# Patient Record
Sex: Female | Born: 1950 | Race: White | Hispanic: No | Marital: Married | Smoking: Current every day smoker
Health system: Southern US, Community
[De-identification: ages and names within clinical notes are randomized; demographics above are authoritative.]

## PROBLEM LIST (undated history)

## (undated) ENCOUNTER — Ambulatory Visit

## (undated) DIAGNOSIS — Z972 Presence of dental prosthetic device (complete) (partial): Secondary | ICD-10-CM

## (undated) DIAGNOSIS — C801 Malignant (primary) neoplasm, unspecified: Secondary | ICD-10-CM

## (undated) DIAGNOSIS — Z85528 Personal history of other malignant neoplasm of kidney: Secondary | ICD-10-CM

## (undated) HISTORY — PX: HERNIA REPAIR: SHX51

---

## 2005-08-21 ENCOUNTER — Emergency Department: Payer: Self-pay | Admitting: Emergency Medicine

## 2008-03-07 ENCOUNTER — Ambulatory Visit: Payer: Self-pay | Admitting: Urology

## 2008-03-20 ENCOUNTER — Ambulatory Visit: Payer: Self-pay | Admitting: Urology

## 2009-09-26 ENCOUNTER — Emergency Department: Payer: Self-pay | Admitting: Emergency Medicine

## 2009-10-25 DIAGNOSIS — Z85528 Personal history of other malignant neoplasm of kidney: Secondary | ICD-10-CM

## 2009-10-25 DIAGNOSIS — C801 Malignant (primary) neoplasm, unspecified: Secondary | ICD-10-CM

## 2009-10-25 HISTORY — DX: Personal history of other malignant neoplasm of kidney: Z85.528

## 2009-10-25 HISTORY — PX: NEPHRECTOMY: SHX65

## 2009-10-25 HISTORY — PX: DIRECT LARYNGOSCOPY: SHX5326

## 2009-10-25 HISTORY — DX: Malignant (primary) neoplasm, unspecified: C80.1

## 2016-03-01 ENCOUNTER — Encounter: Payer: Self-pay | Admitting: *Deleted

## 2016-03-04 ENCOUNTER — Ambulatory Visit: Payer: Medicare HMO | Admitting: Anesthesiology

## 2016-03-04 ENCOUNTER — Ambulatory Visit
Admission: RE | Admit: 2016-03-04 | Discharge: 2016-03-04 | Disposition: A | Discharge status: Home / Self Care | Payer: Medicare HMO | Source: Ambulatory Visit | Attending: Otolaryngology | Admitting: Otolaryngology

## 2016-03-04 ENCOUNTER — Encounter
Admission: RE | Disposition: A | Discharge status: Home / Self Care | Payer: Self-pay | Source: Ambulatory Visit | Attending: Otolaryngology

## 2016-03-04 DIAGNOSIS — Z79899 Other long term (current) drug therapy: Secondary | ICD-10-CM | POA: Insufficient documentation

## 2016-03-04 DIAGNOSIS — J381 Polyp of vocal cord and larynx: Secondary | ICD-10-CM | POA: Diagnosis present

## 2016-03-04 DIAGNOSIS — Z9851 Tubal ligation status: Secondary | ICD-10-CM | POA: Insufficient documentation

## 2016-03-04 DIAGNOSIS — Z8249 Family history of ischemic heart disease and other diseases of the circulatory system: Secondary | ICD-10-CM | POA: Diagnosis not present

## 2016-03-04 DIAGNOSIS — F17200 Nicotine dependence, unspecified, uncomplicated: Secondary | ICD-10-CM | POA: Insufficient documentation

## 2016-03-04 HISTORY — PX: DIRECT LARYNGOSCOPY: SHX5326

## 2016-03-04 HISTORY — DX: Presence of dental prosthetic device (complete) (partial): Z97.2

## 2016-03-04 HISTORY — DX: Personal history of other malignant neoplasm of kidney: Z85.528

## 2016-03-04 SURGERY — LARYNGOSCOPY, DIRECT
Anesthesia: General | Laterality: Left | Wound class: Clean Contaminated

## 2016-03-04 MED ORDER — EPHEDRINE SULFATE 50 MG/ML IJ SOLN
INTRAMUSCULAR | Status: DC | PRN
  Administered 2016-03-04 (×2): 5 mg via INTRAVENOUS

## 2016-03-04 MED ORDER — ONDANSETRON HCL 4 MG/2ML IJ SOLN: 4.0000 mg | Freq: Once | INTRAMUSCULAR | Status: DC | PRN

## 2016-03-04 MED ORDER — SUCCINYLCHOLINE CHLORIDE 20 MG/ML IJ SOLN
INTRAMUSCULAR | Status: DC | PRN
  Administered 2016-03-04: 80 mg via INTRAVENOUS

## 2016-03-04 MED ORDER — PHENYLEPHRINE HCL 0.5 % NA SOLN
NASAL | Status: DC | PRN
  Administered 2016-03-04: 10:00:00 via TOPICAL

## 2016-03-04 MED ORDER — PROPOFOL 10 MG/ML IV BOLUS
INTRAVENOUS | Status: DC | PRN
  Administered 2016-03-04: 130 mg via INTRAVENOUS
  Administered 2016-03-04 (×2): 30 mg via INTRAVENOUS
  Administered 2016-03-04: 10 mg via INTRAVENOUS

## 2016-03-04 MED ORDER — MIDAZOLAM HCL 5 MG/5ML IJ SOLN
INTRAMUSCULAR | Status: DC | PRN
Start: 2016-03-04 — End: 2016-03-04
  Administered 2016-03-04: 2 mg via INTRAVENOUS

## 2016-03-04 MED ORDER — GLYCOPYRROLATE 0.2 MG/ML IJ SOLN
INTRAMUSCULAR | Status: DC | PRN
  Administered 2016-03-04: 0.1 mg via INTRAVENOUS

## 2016-03-04 MED ORDER — SCOPOLAMINE 1 MG/3DAYS TD PT72
1.0000 | MEDICATED_PATCH | Freq: Once | TRANSDERMAL | Status: DC
  Administered 2016-03-04: 1.5 mg via TRANSDERMAL

## 2016-03-04 MED ORDER — ACETAMINOPHEN 10 MG/ML IV SOLN: 1000.0000 mg | Freq: Once | INTRAVENOUS | Status: DC

## 2016-03-04 MED ORDER — FENTANYL CITRATE (PF) 100 MCG/2ML IJ SOLN
INTRAMUSCULAR | Status: DC | PRN
  Administered 2016-03-04: 100 ug via INTRAVENOUS

## 2016-03-04 MED ORDER — ACETAMINOPHEN 10 MG/ML IV SOLN
750.0000 mg | Freq: Once | INTRAVENOUS | Status: AC
  Administered 2016-03-04: 750 mg via INTRAVENOUS

## 2016-03-04 MED ORDER — LACTATED RINGERS IV SOLN
INTRAVENOUS | Status: DC
  Administered 2016-03-04: 09:00:00 via INTRAVENOUS

## 2016-03-04 MED ORDER — FENTANYL CITRATE (PF) 100 MCG/2ML IJ SOLN: 25.0000 ug | INTRAMUSCULAR | Status: DC | PRN

## 2016-03-04 MED ORDER — ONDANSETRON HCL 4 MG/2ML IJ SOLN
INTRAMUSCULAR | Status: DC | PRN
  Administered 2016-03-04: 4 mg via INTRAVENOUS

## 2016-03-04 MED ORDER — LIDOCAINE HCL (CARDIAC) 20 MG/ML IV SOLN
INTRAVENOUS | Status: DC | PRN
  Administered 2016-03-04: 40 mg via INTRAVENOUS

## 2016-03-04 MED ORDER — DEXAMETHASONE SODIUM PHOSPHATE 4 MG/ML IJ SOLN
INTRAMUSCULAR | Status: DC | PRN
  Administered 2016-03-04: 10 mg via INTRAVENOUS

## 2016-03-04 MED ORDER — LIDOCAINE HCL 4 % MT SOLN
OROMUCOSAL | Status: DC | PRN
  Administered 2016-03-04: 2 mL via TOPICAL

## 2016-03-04 SURGICAL SUPPLY — 24 items
BASIN GRAD PLASTIC 32OZ STRL (MISCELLANEOUS) IMPLANT
BLOCK BITE GUARD (MISCELLANEOUS) ×3 IMPLANT
COVER MAYO STAND STRL (DRAPES) ×3 IMPLANT
COVER TABLE BACK 60X90 (DRAPES) ×3 IMPLANT
CUP MEDICINE 2OZ PLAST GRAD ST (MISCELLANEOUS) IMPLANT
DRAPE SHEET LG 3/4 BI-LAMINATE (DRAPES) ×3 IMPLANT
DRESSING TELFA 4X3 1S ST N-ADH (GAUZE/BANDAGES/DRESSINGS) ×3 IMPLANT
GLOVE PI ULTRA LF STRL 7.5 (GLOVE) ×1 IMPLANT
GLOVE PI ULTRA NON LATEX 7.5 (GLOVE) ×2
KIT ROOM TURNOVER OR (KITS) ×3 IMPLANT
MARKER SKIN DUAL TIP RULER LAB (MISCELLANEOUS) IMPLANT
NEEDLE 18GX1X1/2 (RX/OR ONLY) (NEEDLE) IMPLANT
NEEDLE FILTER BLUNT 18X 1/2SAF (NEEDLE)
NEEDLE FILTER BLUNT 18X1 1/2 (NEEDLE) IMPLANT
NS IRRIG 500ML POUR BTL (IV SOLUTION) IMPLANT
PATTIES SURGICAL .5 X.5 (GAUZE/BANDAGES/DRESSINGS) ×3 IMPLANT
SOL ANTI-FOG 6CC FOG-OUT (MISCELLANEOUS) ×1 IMPLANT
SOL FOG-OUT ANTI-FOG 6CC (MISCELLANEOUS) ×2
SPONGE XRAY 4X4 16PLY STRL (MISCELLANEOUS) ×3 IMPLANT
STRAP BODY AND KNEE 60X3 (MISCELLANEOUS) ×3 IMPLANT
SYRINGE 10CC LL (SYRINGE) IMPLANT
TOWEL OR 17X26 4PK STRL BLUE (TOWEL DISPOSABLE) ×3 IMPLANT
TUBING CONN 6MMX3.1M (TUBING) ×2
TUBING SUCTION CONN 0.25 STRL (TUBING) ×1 IMPLANT

## 2016-03-04 NOTE — Anesthesia Preprocedure Evaluation (Signed)
Anesthesia Evaluation  Patient identified by MRN, date of birth, ID band  Reviewed: Allergy & Precautions, H&P , NPO status , Patient's Chart, lab work & pertinent test results  Airway Mallampati: II  TM Distance: >3 FB Neck ROM: full    Dental  (+) Upper Dentures, Lower Dentures   Pulmonary Current Smoker,    Pulmonary exam normal        Cardiovascular  Rhythm:regular Rate:Normal     Neuro/Psych    GI/Hepatic   Endo/Other    Renal/GU      Musculoskeletal   Abdominal   Peds  Hematology   Anesthesia Other Findings   Reproductive/Obstetrics                             Anesthesia Physical Anesthesia Plan  ASA: II  Anesthesia Plan: General ETT   Post-op Pain Management:    Induction:   Airway Management Planned:   Additional Equipment:   Intra-op Plan:   Post-operative Plan:   Informed Consent: I have reviewed the patients History and Physical, chart, labs and discussed the procedure including the risks, benefits and alternatives for the proposed anesthesia with the patient or authorized representative who has indicated his/her understanding and acceptance.     Plan Discussed with: CRNA  Anesthesia Plan Comments:         Anesthesia Quick Evaluation

## 2016-03-04 NOTE — Op Note (Signed)
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  Procedure: The patient was given general anesthesia by oral endotracheal intubation using a size 6 oral endotracheal tube with a large cuff. When she was fully sleep a Dedo laryngoscope was used for visualizing the hypopharynx and larynx. The epiglottis was normal and the vallecula was clear. The piriform sinuses were clear area the larynx was visualized and there is watery polyp on the left true cord with a minimal amount of swelling on the right true cord. The Louis arm was brought in for stabilization of the scope and then endoscopes were used for visualizing this under high-power magnification. The watery polyp was evident on the left true cord.  An up-biting cup forceps was used to grasp the midportion of the polyp. And up cutting scissors were used to incise mucosa anteriorly along the superior border of the true cord. The mucosa was then cut along its more inferior border to remove the polyp. Once this was removed it still see polypoid changes along the more dorsal surface of the true cord. The cup biting forceps was used to grab this and it was removed by scissors again. Polyp extended almost all the way down to the vocal process and was trimmed down there as well as all the way up to the amp cheerier commissure and it was trimmed there. The specimen was in multiple pieces and was sent for permanent section. The muscle of the true cord was intact and partially exposed along the portion of the cord, because of removing the swollen diseased mucosa. Cottonoid pledgets soaked in phenylephrine 100,000 were used for vasoconstriction. There  was minimal oozing evident throughout the procedure. Lidocaine had been squirted in the larynx for helping with anesthesia. Approximately 80 mg were squirted in the larynx and then the excess suction away.  The scope was then removed. The patient was awakened and taken to the recovery room in satisfactory condition. There were no operative complications.  Dispo:   To PACU to be discharged home  Plan:  Voice rest for 1 week. We'll follow up in 1 week to discuss the pathology. She is to avoid smoking. She can increase her diet as tolerated  Carlee Tesfaye H  03/04/2016 10:03 AM

## 2016-03-04 NOTE — Anesthesia Procedure Notes (Signed)
Procedure Name: Intubation Date/Time: 03/04/2016 9:34 AM Performed by: Mayme Genta Pre-anesthesia Checklist: Patient identified, Emergency Drugs available, Suction available, Patient being monitored and Timeout performed Patient Re-evaluated:Patient Re-evaluated prior to inductionOxygen Delivery Method: Circle system utilized Preoxygenation: Pre-oxygenation with 100% oxygen Intubation Type: IV induction Ventilation: Mask ventilation without difficulty Laryngoscope Size: Miller and 2 Grade View: Grade I Tube type: MLT Tube size: 6.0 mm Number of attempts: 1 Placement Confirmation: ETT inserted through vocal cords under direct vision,  positive ETCO2 and breath sounds checked- equal and bilateral Tube secured with: Tape Dental Injury: Teeth and Oropharynx as per pre-operative assessment

## 2016-03-04 NOTE — Discharge Instructions (Signed)
General Anesthesia, Adult, Care After Refer to this sheet in the next few weeks. These instructions provide you with information on caring for yourself after your procedure. Your health care provider may also give you more specific instructions. Your treatment has been planned according to current medical practices, but problems sometimes occur. Call your health care provider if you have any problems or questions after your procedure. WHAT TO EXPECT AFTER THE PROCEDURE After the procedure, it is typical to experience:  Sleepiness.  Nausea and vomiting. HOME CARE INSTRUCTIONS  For the first 24 hours after general anesthesia:  Have a responsible person with you.  Do not drive a car. If you are alone, do not take public transportation.  Do not drink alcohol.  Do not take medicine that has not been prescribed by your health care provider.  Do not sign important papers or make important decisions.  You may resume a normal diet and activities as directed by your health care provider.  Change bandages (dressings) as directed.  If you have questions or problems that seem related to general anesthesia, call the hospital and ask for the anesthetist or anesthesiologist on call. SEEK MEDICAL CARE IF:  You have nausea and vomiting that continue the day after anesthesia.  You develop a rash. SEEK IMMEDIATE MEDICAL CARE IF:   You have difficulty breathing.  You have chest pain.  You have any allergic problems.   This information is not intended to replace advice given to you by your health care provider. Make sure you discuss any questions you have with your health care provider.   Document Released: 01/17/2001 Document Revised: 11/01/2014 Document Reviewed: 02/09/2012 Elsevier Interactive Patient Education 2016 [employer]. Scopolamine skin patches REMOVE IN 72 HOURS. [place] HANDS IMMEDIATELY AFTER. What is this medicine? SCOPOLAMINE (skoe POL a meen) is used to prevent nausea and  vomiting caused by motion sickness, anesthesia and surgery. This medicine may be used for other purposes; ask your health care provider or pharmacist if you have questions. What should I tell my health care provider before I take this medicine? They need to know if you have any of these conditions: -glaucoma -kidney or liver disease -an unusual or allergic reaction (especially skin allergy) to scopolamine, atropine, other medicines, foods, dyes, or preservatives -pregnant or trying to get pregnant -breast-feeding How should I use this medicine? This medicine is for external use only. Follow the directions on the prescription label. One patch contains enough medicine to prevent motion sickness for up to 3 days. Apply the patch at least 4 hours before you need it and only wear one disc at a time. Choose an area behind the ear, that is clean, dry, hairless and free from any cuts or irritation. Wipe the area with a clean dry tissue. Peel off the plastic backing of the skin patch, trying not to touch the adhesive side with your hands. Do not cut the patches. Firmly apply to the area you have chosen, with the metallic side of the patch to the skin and the tan-colored side showing. Once firmly in place, wash your hands well with soap and water. Remove the disc after 3 days, or sooner if you no longer need it. After removing the patch, wash your hands and the area behind your ear thoroughly with soap and water. The patch will still contain some medicine after use. To avoid accidental contact or ingestion by children or pets, fold the used patch in half with the sticky side together and throw away  in the trash out of the reach of children and pets. If you need to use a second patch after you remove the first, place it behind the other ear. Talk to your pediatrician regarding the use of this medicine in children. Special care may be needed. Overdosage: If you think you have taken too much of this medicine contact  a poison control center or emergency room at once. NOTE: This medicine is only for you. Do not share this medicine with others. What if I miss a dose? Make sure you apply the patch at least 4 hours before you need it. You can apply it the night before traveling. What may interact with this medicine? -benztropine -bethanechol -medicines for anxiety or sleeping problems like diazepam or temazepam -medicines for hay fever and other allergies -medicines for mental depression -muscle relaxants This list may not describe all possible interactions. Give your health care provider a list of all the medicines, herbs, non-prescription drugs, or dietary supplements you use. Also tell them if you smoke, drink alcohol, or use illegal drugs. Some items may interact with your medicine. What should I watch for while using this medicine? Keep the patch dry, if possible, to prevent it from falling off. Limited contact with water, however, as in bathing or swimming, will not affect the system. If the patch falls off, throw it away and put a new one behind the other ear. You may get drowsy or dizzy. Do not drive, use machinery, or do anything that needs mental alertness until you know how this medicine affects you. Do not stand or sit up quickly, especially if you are an older patient. This reduces the risk of dizzy or fainting spells. Alcohol may interfere with the effect of this medicine. Avoid alcoholic drinks. Your mouth may get dry. Chewing sugarless gum or sucking hard candy, and drinking plenty of water may help. Contact your doctor if the problem does not go away or is severe. This medicine may cause dry eyes and blurred vision. If you wear contact lenses you may feel some discomfort. Lubricating drops may help. See your eye doctor if the problem does not go away or is severe. If you are going to have a magnetic resonance imaging (MRI) procedure, tell your MRI technician if you have this patch on your body. It  must be removed before a MRI. What side effects may I notice from receiving this medicine? Side effects that you should report to your doctor or health care professional as soon as possible: -agitation, nervousness, confusion -blurred vision and other eye problems -dizziness, drowsiness -eye pain or redness in the whites of the eye -hallucinations -pain or difficulty passing urine -skin rash, itching -vomiting Side effects that usually do not require medical attention (report to your doctor or health care professional if they continue or are bothersome): -headache -nausea This list may not describe all possible side effects. Call your doctor for medical advice about side effects. You may report side effects to FDA at 1-800-FDA-1088. Where should I keep my medicine? Keep out of the reach of children. Store at room temperature between 20 and 25 degrees C (68 and 77 degrees F). Throw away any unused medicine after the expiration date. When you remove a patch, fold it and throw it in the trash as described above. NOTE: This sheet is a summary. It may not cover all possible information. If you have questions about this medicine, talk to your doctor, pharmacist, or health care provider.    2016,  Elsevier/Gold Standard. (2012-03-09 13:31:48)

## 2016-03-04 NOTE — H&P (Signed)
  H&P has been reviewed and no changes necessary. To be downloaded later. 

## 2016-03-04 NOTE — Anesthesia Postprocedure Evaluation (Signed)
Anesthesia Post Note  Patient: Vanessa Ball  Procedure(s) Performed: Procedure(s) (LRB): DIRECT LARYNGOSCOPY WITH EXCISION OF LEFT VOCAL CORD polyp (Left)  Patient location during evaluation: PACU Anesthesia Type: General Level of consciousness: awake and alert and oriented Pain management: satisfactory to patient Vital Signs Assessment: post-procedure vital signs reviewed and stable Respiratory status: spontaneous breathing, nonlabored ventilation and respiratory function stable Cardiovascular status: blood pressure returned to baseline and stable Postop Assessment: Adequate PO intake and No signs of nausea or vomiting Anesthetic complications: no    Raliegh Ip

## 2016-03-04 NOTE — Transfer of Care (Signed)
Immediate Anesthesia Transfer of Care Note  Patient: Vanessa Ball  Procedure(s) Performed: Procedure(s): DIRECT LARYNGOSCOPY WITH EXCISION OF LEFT VOCAL CORD polyp (Left)  Patient Location: PACU  Anesthesia Type: General ETT  Level of Consciousness: awake, alert  and patient cooperative  Airway and Oxygen Therapy: Patient Spontanous Breathing and Patient connected to supplemental oxygen  Post-op Assessment: Post-op Vital signs reviewed, Patient's Cardiovascular Status Stable, Respiratory Function Stable, Patent Airway and No signs of Nausea or vomiting  Post-op Vital Signs: Reviewed and stable  Complications: No apparent anesthesia complications

## 2016-03-05 ENCOUNTER — Encounter: Payer: Self-pay | Admitting: Otolaryngology

## 2016-03-08 LAB — SURGICAL PATHOLOGY

## 2016-11-02 ENCOUNTER — Other Ambulatory Visit: Payer: Self-pay | Admitting: Family

## 2016-11-02 DIAGNOSIS — Z1239 Encounter for other screening for malignant neoplasm of breast: Secondary | ICD-10-CM

## 2016-11-30 ENCOUNTER — Ambulatory Visit
Admission: RE | Admit: 2016-11-30 | Discharge: 2016-11-30 | Disposition: A | Discharge status: Home / Self Care | Payer: Medicare HMO | Source: Ambulatory Visit | Attending: Family | Admitting: Family

## 2016-11-30 DIAGNOSIS — Z1231 Encounter for screening mammogram for malignant neoplasm of breast: Secondary | ICD-10-CM | POA: Diagnosis present

## 2016-11-30 DIAGNOSIS — Z1239 Encounter for other screening for malignant neoplasm of breast: Secondary | ICD-10-CM

## 2016-11-30 HISTORY — DX: Malignant (primary) neoplasm, unspecified: C80.1

## 2016-12-03 ENCOUNTER — Inpatient Hospital Stay
Admission: RE | Admit: 2016-12-03 | Discharge: 2016-12-03 | Disposition: A | Discharge status: Home / Self Care | Payer: Self-pay | Source: Ambulatory Visit | Attending: *Deleted | Admitting: *Deleted

## 2016-12-03 ENCOUNTER — Other Ambulatory Visit: Payer: Self-pay | Admitting: *Deleted

## 2016-12-03 DIAGNOSIS — Z1231 Encounter for screening mammogram for malignant neoplasm of breast: Secondary | ICD-10-CM

## 2017-03-24 ENCOUNTER — Ambulatory Visit
Admission: EM | Admit: 2017-03-24 | Discharge: 2017-03-24 | Disposition: A | Discharge status: Home / Self Care | Payer: Medicare HMO | Attending: Family Medicine | Admitting: Family Medicine

## 2017-03-24 DIAGNOSIS — S30860A Insect bite (nonvenomous) of lower back and pelvis, initial encounter: Secondary | ICD-10-CM

## 2017-03-24 DIAGNOSIS — W57XXXA Bitten or stung by nonvenomous insect and other nonvenomous arthropods, initial encounter: Secondary | ICD-10-CM

## 2017-03-24 DIAGNOSIS — S20469A Insect bite (nonvenomous) of unspecified back wall of thorax, initial encounter: Secondary | ICD-10-CM

## 2017-03-24 DIAGNOSIS — L089 Local infection of the skin and subcutaneous tissue, unspecified: Secondary | ICD-10-CM | POA: Diagnosis not present

## 2017-03-24 LAB — CBC WITH DIFFERENTIAL/PLATELET
Basophils Absolute: 0.1 10*3/uL (ref 0–0.1)
Basophils Relative: 1 %
Eosinophils Absolute: 0.2 10*3/uL (ref 0–0.7)
Eosinophils Relative: 2 %
HCT: 44.1 % (ref 35.0–47.0)
Hemoglobin: 14.9 g/dL (ref 12.0–16.0)
Lymphocytes Relative: 41 %
Lymphs Abs: 3.2 10*3/uL (ref 1.0–3.6)
MCH: 32.5 pg (ref 26.0–34.0)
MCHC: 33.8 g/dL (ref 32.0–36.0)
MCV: 96.1 fL (ref 80.0–100.0)
Monocytes Absolute: 0.6 10*3/uL (ref 0.2–0.9)
Monocytes Relative: 8 %
Neutro Abs: 3.8 10*3/uL (ref 1.4–6.5)
Neutrophils Relative %: 48 %
Platelets: 275 10*3/uL (ref 150–440)
RBC: 4.6 MIL/uL (ref 3.80–5.20)
RDW: 13.1 % (ref 11.5–14.5)
WBC: 7.9 10*3/uL (ref 3.6–11.0)

## 2017-03-24 MED ORDER — MUPIROCIN CALCIUM 2 % EX CREA: 1.0000 "application " | TOPICAL_CREAM | Freq: Two times a day (BID) | CUTANEOUS | 0 refills | Status: DC

## 2017-03-24 MED ORDER — DOXYCYCLINE HYCLATE 100 MG PO CAPS: 100.0000 mg | ORAL_CAPSULE | Freq: Two times a day (BID) | ORAL | 0 refills | Status: DC

## 2017-03-24 NOTE — ED Provider Notes (Signed)
[facility]    CSN: [id number] Arrival date & time: 03/24/17  1220     History   Chief Complaint Chief Complaint  Patient presents with  . Tick Removal    HPI Vanessa Ball is a 66 y.o. female.   Patient's here because of a tick bite removal that occurred on Sunday, May 20. She reports that she was in the shower felt something on the back scratched and Levbid enough that came off. As was going down the drain in the shower she saw there was a tick attached to some skin. She states she would be to try to remove the tick in that fashion but was able to see the tick at the time we'll stashed her back. She lives by herself. States that since then the areas become red and inflamed she shoulder to some friends who were quite concerned so she came in to be seen and evaluated. Ingesting her mother had RMSF and required hospitalization at one time she reports she only has one kidney. 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 She denies any medical problems and she does smoke. No known drug allergies.   The history is provided by the patient. No language interpreter was used.  Animal Bite  Contact animal:  Insect Pain details:    Quality:  Itching and stinging   Severity:  Mild Incident location:  Home Associated symptoms: no fever     Past Medical History:  Diagnosis Date  . Cancer [facility]) 2011   kidney ca- removed  rt kidney and ureter  . History of kidney cancer 2011  . Wears dentures    full upper and lower    There are no active problems to display for this patient.   Past Surgical History:  Procedure Laterality Date  . DIRECT LARYNGOSCOPY  2011   [facility]  . DIRECT LARYNGOSCOPY Left 03/04/2016   Procedure: DIRECT LARYNGOSCOPY WITH EXCISION OF LEFT VOCAL CORD polyp;  Surgeon: Margaretha Sheffield, MD;  Location: [facility];  Service: ENT;  Laterality: Left;  . HERNIA REPAIR    . 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  [facility]      OB History    No data available       Home Medications    Prior to Admission medications   Medication Sig Start Date End Date Taking? Authorizing Provider  ascorbic acid (VITAMIN C) 500 MG tablet Take 500 mg by mouth daily.    [provider]  benzonatate (TESSALON) 200 MG capsule Take 200 mg by mouth 3 (three) times daily as needed for cough.    [provider]  calcium citrate-vitamin D (CITRACAL+D) 315-200 MG-UNIT tablet Take 1 tablet by mouth 2 (two) times daily.    [provider]  docusate sodium (COLACE) 100 MG capsule Take 100 mg by mouth 2 (two) times daily as needed for mild constipation.    [provider]  doxycycline (VIBRAMYCIN) 100 MG capsule Take 1 capsule (100 mg total) by mouth 2 (two) times daily. 03/24/17   Frederich Cha, MD  mupirocin cream (BACTROBAN) 2 % Apply 1 application topically 2 (two) times daily. 03/24/17   Frederich Cha, MD    Family History Family History  Problem Relation Age of Onset  . Breast cancer Neg Hx     Social History Social History  Substance Use Topics  . Smoking status: Current Every Day Smoker    Packs/day: 0.50  Years: 30.00    Types: Cigarettes  . Smokeless tobacco: Never Used  . Alcohol use No     Allergies   Patient has no known allergies.   Review of Systems Review of Systems  Constitutional: Negative for fatigue and fever.  Skin: Positive for color change and wound.  All other systems reviewed and are negative.    Physical Exam Triage Vital Signs ED Triage Vitals  Enc Vitals Group     BP 03/24/17 1235 (!) 125/98     Pulse Rate 03/24/17 1235 92     Resp 03/24/17 1235 18     Temp 03/24/17 1235 98.3 F (36.8 C)     Temp Source 03/24/17 1235 Oral     SpO2 03/24/17 1235 100 %     Weight 03/24/17 1236 110 lb (49.9 kg)     Height 03/24/17 1236 5\' 1"  (1.549 m)     Head Circumference --      Peak Flow --      Pain Score 03/24/17 1237 2     Pain Loc --      Pain Edu? --       Excl. in Drain? --    No data found.   Updated Vital Signs BP (!) 125/98 (BP Location: Left Arm)   Pulse 92   Temp 98.3 F (36.8 C) (Oral)   Resp 18   Ht 5\' 1"  (1.549 m)   Wt 110 lb (49.9 kg)   SpO2 100%   BMI 20.78 kg/m   Visual Acuity Right Eye Distance:   Left Eye Distance:   Bilateral Distance:    Right Eye Near:   Left Eye Near:    Bilateral Near:     Physical Exam  Constitutional: She is oriented to person, place, and time. She appears well-developed and well-nourished. She appears distressed.  Eyes: Pupils are equal, round, and reactive to light.  Neck: Normal range of motion. Neck supple.  Pulmonary/Chest: Effort normal.  Musculoskeletal: Normal range of motion.  Neurological: She is alert and oriented to person, place, and time.  Skin: Skin is warm. Lesion and rash noted. There is erythema.     Appears to be in the mid upper back were appears to be a insect bite that is erythematous and red over the wound of the insect bite it is not fluctuant does not require any opening at this time  Psychiatric: Her mood appears anxious.     UC Treatments / Results  Labs (all labs ordered are listed, but only abnormal results are displayed) Labs Reviewed  CBC WITH DIFFERENTIAL/PLATELET  ROCKY MTN SPOTTED FVR ABS PNL(IGG+IGM)  B. BURGDORFI ANTIBODIES    EKG  EKG Interpretation None       Radiology No results found.  Procedures Procedures (including critical care time)  Medications Ordered in UC Medications - No data to display   Initial Impression / Assessment and Plan / UC Course  I have reviewed the triage vital signs and the nursing notes.  Pertinent labs & imaging results that were available during my care of the patient were reviewed by me and considered in my medical decision making (see chart for details).     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  Final Clinical Impressions(s) / UC Diagnoses   Final diagnoses:  Tick bite of back, initial encounter  Infected insect bite of back, initial encounter    New Prescriptions New Prescriptions   DOXYCYCLINE (VIBRAMYCIN) 100 MG CAPSULE    Take 1 capsule (100 mg total) by mouth 2 (two) times daily.   MUPIROCIN CREAM (BACTROBAN) 2 %    Apply 1 application topically 2 (two) times daily.    Note: This dictation was prepared with Dragon dictation along with smaller phrase technology. Any transcriptional errors that result from this process are unintentional.   Frederich Cha, MD 03/24/17 1319

## 2017-03-24 NOTE — ED Triage Notes (Signed)
Pt noticed a tick on her back over the weekend and pulled it off and she says it has gotten infected.

## 2017-03-26 LAB — ROCKY MTN SPOTTED FVR ABS PNL(IGG+IGM)
RMSF IgG: NEGATIVE
RMSF IgM: 0.25 index (ref 0.00–0.89)

## 2017-03-28 LAB — B. BURGDORFI ANTIBODIES: B burgdorferi Ab IgG+IgM: <0.91 {ISR} (ref 0.00–0.90)

## 2018-01-13 ENCOUNTER — Other Ambulatory Visit: Payer: Self-pay | Admitting: Family

## 2018-01-13 DIAGNOSIS — Z1231 Encounter for screening mammogram for malignant neoplasm of breast: Secondary | ICD-10-CM

## 2018-01-25 ENCOUNTER — Ambulatory Visit
Admission: RE | Admit: 2018-01-25 | Discharge: 2018-01-25 | Disposition: A | Discharge status: Home / Self Care | Payer: Medicare HMO | Source: Ambulatory Visit | Attending: Internal Medicine | Admitting: Internal Medicine

## 2018-01-25 ENCOUNTER — Encounter
Admission: RE | Disposition: A | Discharge status: Home / Self Care | Payer: Self-pay | Source: Ambulatory Visit | Attending: Internal Medicine

## 2018-01-25 ENCOUNTER — Encounter: Payer: Self-pay | Admitting: Certified Registered Nurse Anesthetist

## 2018-01-25 ENCOUNTER — Ambulatory Visit: Payer: Medicare HMO | Admitting: Certified Registered Nurse Anesthetist

## 2018-01-25 DIAGNOSIS — K621 Rectal polyp: Secondary | ICD-10-CM | POA: Diagnosis not present

## 2018-01-25 DIAGNOSIS — Z8371 Family history of colonic polyps: Secondary | ICD-10-CM | POA: Diagnosis not present

## 2018-01-25 DIAGNOSIS — K640 First degree hemorrhoids: Secondary | ICD-10-CM | POA: Diagnosis not present

## 2018-01-25 DIAGNOSIS — Z85528 Personal history of other malignant neoplasm of kidney: Secondary | ICD-10-CM | POA: Diagnosis not present

## 2018-01-25 DIAGNOSIS — F17200 Nicotine dependence, unspecified, uncomplicated: Secondary | ICD-10-CM | POA: Insufficient documentation

## 2018-01-25 DIAGNOSIS — K5730 Diverticulosis of large intestine without perforation or abscess without bleeding: Secondary | ICD-10-CM | POA: Insufficient documentation

## 2018-01-25 DIAGNOSIS — Z86010 Personal history of colonic polyps: Secondary | ICD-10-CM | POA: Insufficient documentation

## 2018-01-25 DIAGNOSIS — Z1211 Encounter for screening for malignant neoplasm of colon: Secondary | ICD-10-CM | POA: Diagnosis not present

## 2018-01-25 DIAGNOSIS — Z79899 Other long term (current) drug therapy: Secondary | ICD-10-CM | POA: Diagnosis not present

## 2018-01-25 HISTORY — PX: COLONOSCOPY WITH PROPOFOL: SHX5780

## 2018-01-25 SURGERY — COLONOSCOPY WITH PROPOFOL
Anesthesia: General

## 2018-01-25 MED ORDER — LIDOCAINE HCL (CARDIAC) 20 MG/ML IV SOLN
INTRAVENOUS | Status: DC | PRN
  Administered 2018-01-25: 50 mg via INTRAVENOUS

## 2018-01-25 MED ORDER — PROPOFOL 500 MG/50ML IV EMUL
INTRAVENOUS | Status: AC
  Filled 2018-01-25: qty 50

## 2018-01-25 MED ORDER — PROPOFOL 10 MG/ML IV BOLUS
INTRAVENOUS | Status: DC | PRN
  Administered 2018-01-25: 30 mg via INTRAVENOUS
  Administered 2018-01-25 (×2): 40 mg via INTRAVENOUS

## 2018-01-25 MED ORDER — SODIUM CHLORIDE 0.9 % IV SOLN
INTRAVENOUS | Status: DC
  Administered 2018-01-25: 1000 mL via INTRAVENOUS

## 2018-01-25 MED ORDER — PROPOFOL 500 MG/50ML IV EMUL
INTRAVENOUS | Status: DC | PRN
  Administered 2018-01-25: 150 ug/kg/min via INTRAVENOUS

## 2018-01-25 MED ORDER — ONDANSETRON HCL 4 MG/2ML IJ SOLN
INTRAMUSCULAR | Status: DC | PRN
  Administered 2018-01-25: 4 mg via INTRAVENOUS

## 2018-01-25 NOTE — H&P (Signed)
Outpatient short stay form Pre-procedure 01/25/2018 8:54 AM Vanessa Ball K. Alice Reichert, M.D.  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 Ivor Messier). Also has family history of colon polyps in her mother. Denies change in bowel habits, rectal bleeding, abdominal pain.     Current Facility-Administered Medications:  .  0.9 %  sodium chloride infusion, , Intravenous, Continuous, [place], Benay Pike, MD, Last Rate: 20 mL/hr at 01/25/18 0840  Medications Prior to Admission  Medication Sig Dispense Refill Last Dose  . ibandronate (BONIVA) 150 MG tablet Take 150 mg by mouth every 30 (thirty) days. Take in the morning with a full glass of water, on an empty stomach, and do not take anything else by mouth or lie down for the next 30 min.     Marland Kitchen ascorbic acid (VITAMIN C) 500 MG tablet Take 500 mg by mouth daily.   Past Week at Unknown time  . benzonatate (TESSALON) 200 MG capsule Take 200 mg by mouth 3 (three) times daily as needed for cough.   Past Week at Unknown time  . calcium citrate-vitamin D (CITRACAL+D) 315-200 MG-UNIT tablet Take 1 tablet by mouth 2 (two) times daily.   Past Week at Unknown time  . docusate sodium (COLACE) 100 MG capsule Take 100 mg by mouth 2 (two) times daily as needed for mild constipation.   Past Week at Unknown time  . mupirocin cream (BACTROBAN) 2 % Apply 1 application topically 2 (two) times daily. 22 g 0      No Known Allergies   Past Medical History:  Diagnosis Date  . Cancer [facility]) 2011   kidney ca- removed  rt kidney and ureter  . History of kidney cancer 2011  . Wears dentures    full upper and lower    Review of systems:      Physical Exam  Gen: Alert, oriented. Appears stated age.  HEENT: [place]/AT. PERRLA. Lungs: CTA, no wheezes. CV:  RR nl S1, S2. Abd: soft, benign, no masses. BS+ Ext: No edema. Pulses 2+    Planned procedures: Colonoscopy. The patient understands the nature of the planned procedure, indications, risks, alternatives and potential complications including but not limited to bleeding, infection, perforation, damage to internal organs and possible oversedation/side effects from anesthesia. The patient agrees and gives consent to proceed.  Please refer to procedure notes for findings, recommendations and patient disposition/instructions.    Duvid Smalls K. Alice Reichert, M.D. Gastroenterology 01/25/2018  8:54 AM

## 2018-01-25 NOTE — Anesthesia Postprocedure Evaluation (Signed)
Anesthesia Post Note  Patient: Vanessa Ball  Procedure(s) Performed: COLONOSCOPY WITH PROPOFOL (N/A )  Patient location during evaluation: Endoscopy Anesthesia Type: General Level of consciousness: awake and alert Pain management: pain level controlled Vital Signs Assessment: post-procedure vital signs reviewed and stable Respiratory status: spontaneous breathing, nonlabored ventilation, respiratory function stable and patient connected to nasal cannula oxygen Cardiovascular status: blood pressure returned to baseline and stable Postop Assessment: no apparent nausea or vomiting Anesthetic complications: no     Last Vitals:  Vitals:   01/25/18 0930 01/25/18 0940  BP: (!) 160/63 (!) 134/95  Pulse:  71  Resp:  17  Temp:    SpO2:  99%    Last Pain:  Vitals:   01/25/18 0920  TempSrc: Tympanic  PainSc:                  Oyinkansola Truax S

## 2018-01-25 NOTE — Transfer of Care (Signed)
Immediate Anesthesia Transfer of Care Note  Patient: Vanessa Ball  Procedure(s) Performed: COLONOSCOPY WITH PROPOFOL (N/A )  Patient Location: PACU  Anesthesia Type:General  Level of Consciousness: awake  Airway & Oxygen Therapy: Patient Spontanous Breathing and Patient connected to nasal cannula oxygen  Post-op Assessment: Report given to RN and Post -op Vital signs reviewed and stable  Post vital signs: Reviewed and stable  Last Vitals:  Vitals Value Taken Time  BP 120/84 01/25/2018  9:26 AM  Temp 36.1 C 01/25/2018  9:20 AM  Pulse 80 01/25/2018  9:26 AM  Resp 12 01/25/2018  9:26 AM  SpO2 97 % 01/25/2018  9:26 AM  Vitals shown include unvalidated device data.  Last Pain:  Vitals:   01/25/18 0920  TempSrc: Tympanic  PainSc:          Complications: No apparent anesthesia complications

## 2018-01-25 NOTE — Interval H&P Note (Signed)
History and Physical Interval Note:  01/25/2018 8:56 AM  Vanessa Ball  has presented today for surgery, with the diagnosis of HX POLYPS  The various methods of treatment have been discussed with the patient and family. After consideration of risks, benefits and other options for treatment, the patient has consented to  Procedure(s): COLONOSCOPY WITH PROPOFOL (N/A) as a surgical intervention .  The patient's history has been reviewed, patient examined, no change in status, stable for surgery.  I have reviewed the patient's chart and labs.  Questions were answered to the patient's satisfaction.     [place], [place]

## 2018-01-25 NOTE — Anesthesia Post-op Follow-up Note (Signed)
Anesthesia QCDR form completed.        

## 2018-01-25 NOTE — Anesthesia Preprocedure Evaluation (Signed)
Anesthesia Evaluation  Patient identified by MRN, date of birth, ID band Patient awake    Reviewed: Allergy & Precautions, NPO status , Patient's Chart, lab work & pertinent test results, reviewed documented beta blocker date and time   Airway Mallampati: II  TM Distance: >3 FB     Dental  (+) Upper Dentures, Lower Dentures   Pulmonary Current Smoker,           Cardiovascular      Neuro/Psych    GI/Hepatic   Endo/Other    Renal/GU      Musculoskeletal   Abdominal   Peds  Hematology   Anesthesia Other Findings   Reproductive/Obstetrics                             Anesthesia Physical Anesthesia Plan  ASA: II  Anesthesia Plan: General   Post-op Pain Management:    Induction: Intravenous  PONV Risk Score and Plan:   Airway Management Planned:   Additional Equipment:   Intra-op Plan:   Post-operative Plan:   Informed Consent: I have reviewed the patients History and Physical, chart, labs and discussed the procedure including the risks, benefits and alternatives for the proposed anesthesia with the patient or authorized representative who has indicated his/her understanding and acceptance.     Plan Discussed with: CRNA  Anesthesia Plan Comments:         Anesthesia Quick Evaluation

## 2018-01-25 NOTE — Anesthesia Procedure Notes (Signed)
Date/Time: 01/25/2018 9:03 AM Performed by: Johnna Acosta, CRNA Pre-anesthesia Checklist: Patient identified, Emergency Drugs available, Patient being monitored, Suction available and Timeout performed Patient Re-evaluated:Patient Re-evaluated prior to induction Oxygen Delivery Method: Nasal cannula Preoxygenation: Pre-oxygenation with 100% oxygen

## 2018-01-25 NOTE — Op Note (Signed)
[facility] Gastroenterology Patient Name: Vanessa Ball Procedure Date: 01/25/2018 8:51 AM MRN: [id number] Account #: [id number] Date of Birth: 04/15/1951 Admit Type: Outpatient Age: 67 Room: [facility] ENDO ROOM 4 Gender: Female Note Status: Finalized Procedure:            Colonoscopy Indications:          High risk colon cancer surveillance: Personal history                        of sessile serrated colon polyp (less than 10 mm in                        size) with no dysplasia Providers:            Benay Pike. Alice Reichert MD, MD Referring MD:         Warren Danes (Referring MD) Medicines:            Propofol per Anesthesia Complications:        No immediate complications. Procedure:            Pre-Anesthesia Assessment:                       - The risks and benefits of the procedure and the                        sedation options and risks were discussed with the                        patient. All questions were answered and informed                        consent was obtained.                       - Patient identification and proposed procedure were                        verified prior to the procedure by the nurse. The                        procedure was verified in the procedure room.                       - ASA Grade Assessment: II - A patient with mild                        systemic disease.                       - After reviewing the risks and benefits, the patient                        was deemed in satisfactory condition to undergo the                        procedure.                       After obtaining informed consent, the colonoscope was  passed under direct vision. Throughout the procedure,                        the patient's blood pressure, pulse, and oxygen                        saturations were monitored continuously. The                        Colonoscope was introduced through the anus and   advanced to the the cecum, identified by appendiceal                        orifice and ileocecal valve. The colonoscopy was                        somewhat difficult due to restricted mobility of the                        colon. Successful completion of the procedure was aided                        by withdrawing and reinserting the scope. The patient                        tolerated the procedure well. The quality of the bowel                        preparation was good. The ileocecal valve, appendiceal                        orifice, and rectum were photographed. Findings:      The perianal and digital rectal examinations were normal. Pertinent       negatives include normal sphincter tone and no palpable rectal lesions.      Multiple small and large-mouthed diverticula were found in the sigmoid       colon.      Three sessile polyps were found in the rectum. The polyps were 2 to 3 mm       in size. These polyps were removed with a jumbo cold forceps. Resection       and retrieval were complete.      Non-bleeding internal hemorrhoids were found during retroflexion. The       hemorrhoids were Grade I (internal hemorrhoids that do not prolapse).      An area of mildly congested mucosa was found in the sigmoid colon and in       the distal sigmoid colon. Biopsies were taken with a cold forceps for       histology.      The exam was otherwise without abnormality. Impression:           - Diverticulosis in the sigmoid colon.                       - Three 2 to 3 mm polyps in the rectum, removed with a                        jumbo cold forceps. Resected and retrieved.                       -  Non-bleeding internal hemorrhoids.                       - Congested mucosa in the sigmoid colon and in the                        distal sigmoid colon. Biopsied.                       - The examination was otherwise normal. Recommendation:       - Patient has a contact number available for                         emergencies. The signs and symptoms of potential                        delayed complications were discussed with the patient.                        Return to normal activities tomorrow. Written discharge                        instructions were provided to the patient.                       - Resume previous diet.                       - Continue present medications.                       - Await pathology results.                       - Repeat colonoscopy in 5 years for surveillance.                       - The findings and recommendations were discussed with                        the patient and their family. Procedure Code(s):    --- Professional ---                       [phone], Colonoscopy, flexible; with biopsy, single or                        multiple Diagnosis Code(s):    --- Professional ---                       Z86.010, Personal history of colonic polyps                       K64.0, First degree hemorrhoids                       K62.1, Rectal polyp                       K63.89, Other specified diseases of intestine                       K57.30, Diverticulosis of large intestine without  perforation or abscess without bleeding CPT copyright 2017 American Medical Association. All rights reserved. The codes documented in this report are preliminary and upon coder review may  be revised to meet current compliance requirements. Efrain Sella MD, MD 01/25/2018 9:25:55 AM This report has been signed electronically. Number of Addenda: 0 Note Initiated On: 01/25/2018 8:51 AM Scope Withdrawal Time: 0 hours 6 minutes 17 seconds  Total Procedure Duration: 0 hours 13 minutes 47 seconds       [facility]

## 2018-01-26 LAB — SURGICAL PATHOLOGY

## 2018-01-27 ENCOUNTER — Encounter: Payer: Self-pay | Admitting: Internal Medicine

## 2018-01-30 ENCOUNTER — Ambulatory Visit
Admission: RE | Admit: 2018-01-30 | Discharge: 2018-01-30 | Disposition: A | Discharge status: Home / Self Care | Payer: Medicare HMO | Source: Ambulatory Visit | Attending: Family | Admitting: Family

## 2018-01-30 DIAGNOSIS — Z1231 Encounter for screening mammogram for malignant neoplasm of breast: Secondary | ICD-10-CM | POA: Insufficient documentation

## 2020-03-03 IMAGING — MG MM DIGITAL SCREENING BILAT W/ TOMO W/ CAD
8 of 12 series · 8 of 28 positions shown · non-contrast
Comparison: Previous exam(s).

CLINICAL DATA: Screening.

EXAM:
DIGITAL SCREENING BILATERAL MAMMOGRAM WITH TOMO AND CAD

[R CC]
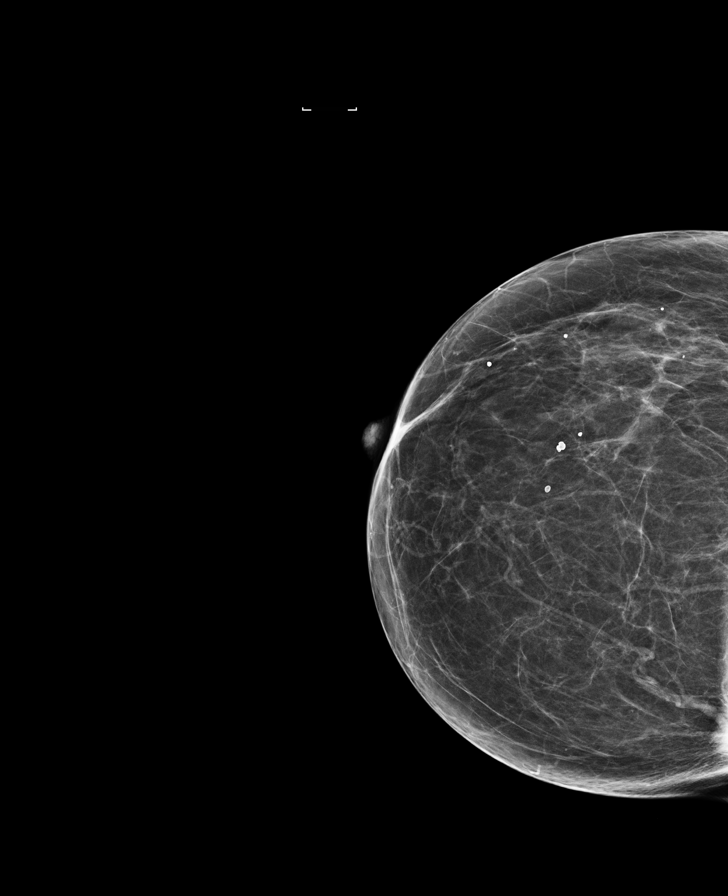

[R MLO synth-2D]
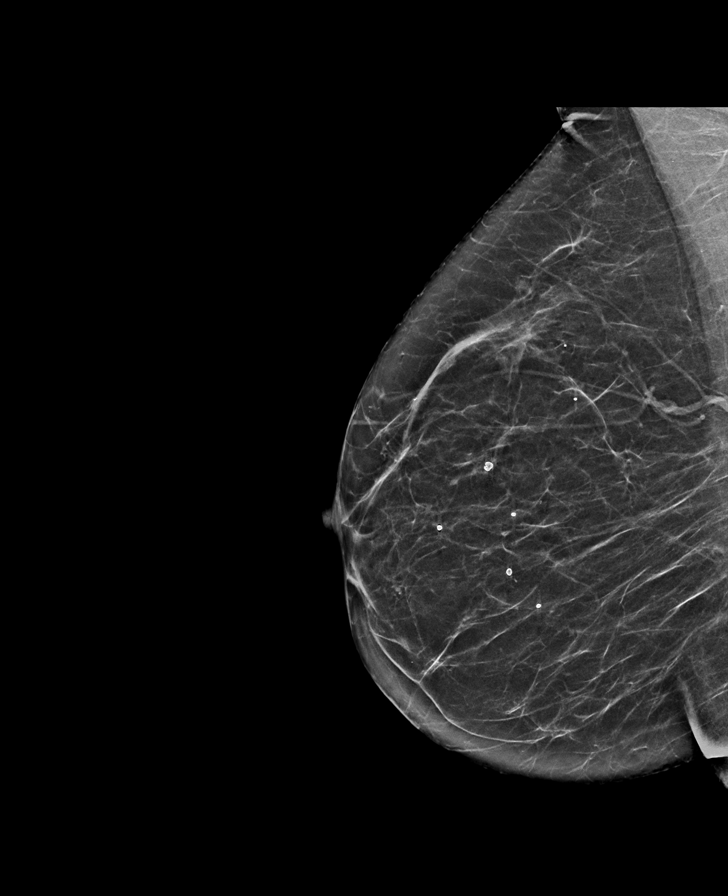

[R MLO]
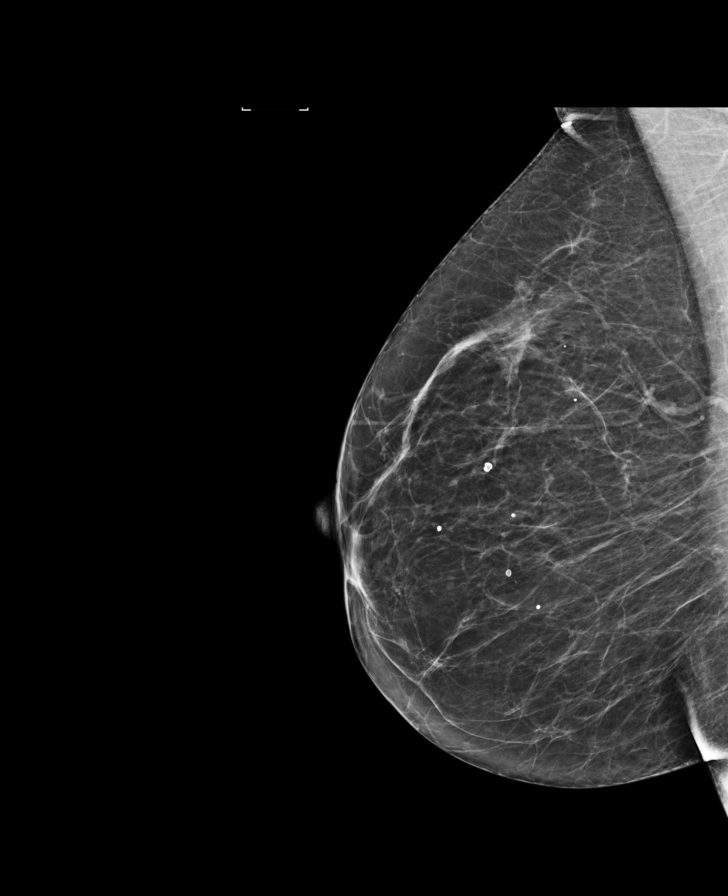

[R CC synth-2D]
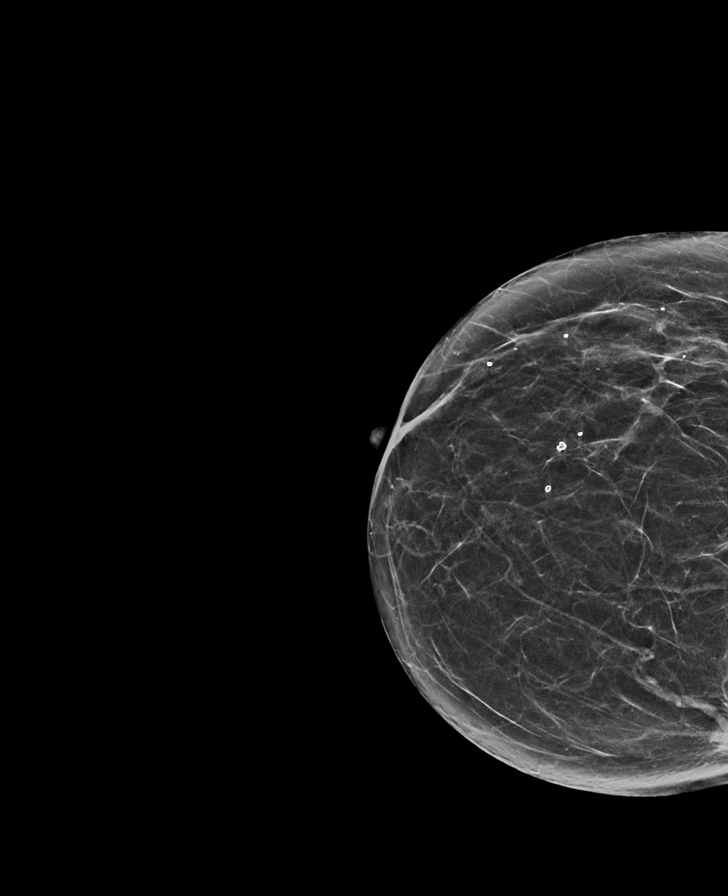

[L MLO]
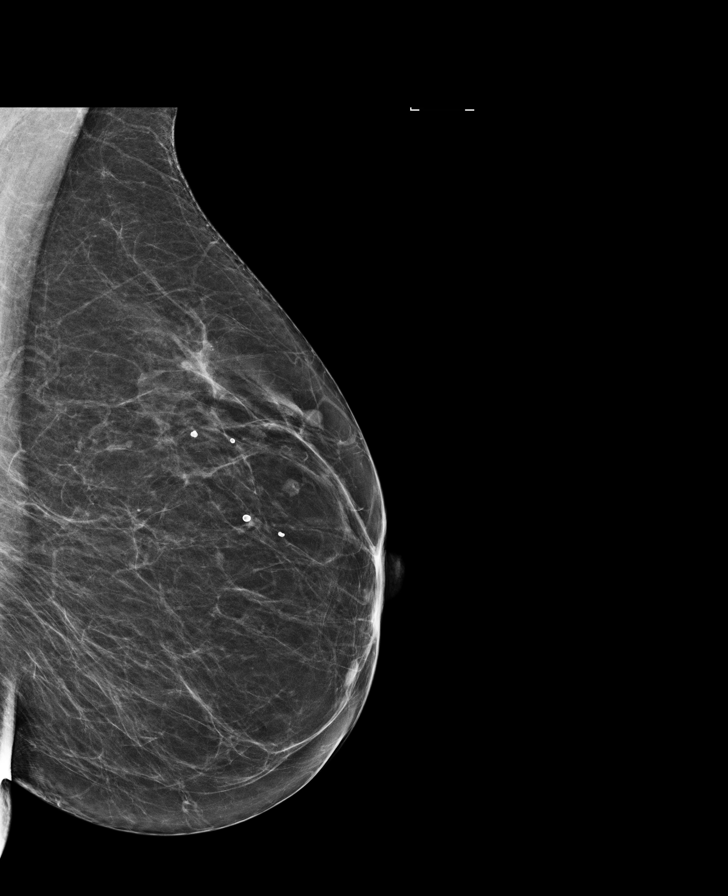

[L CC]
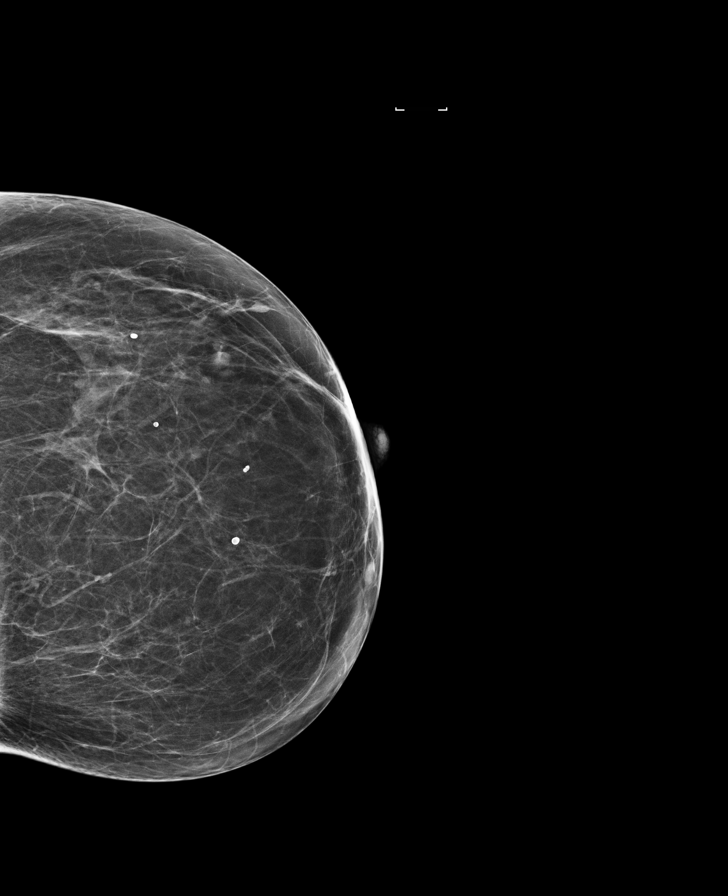

[L CC synth-2D]
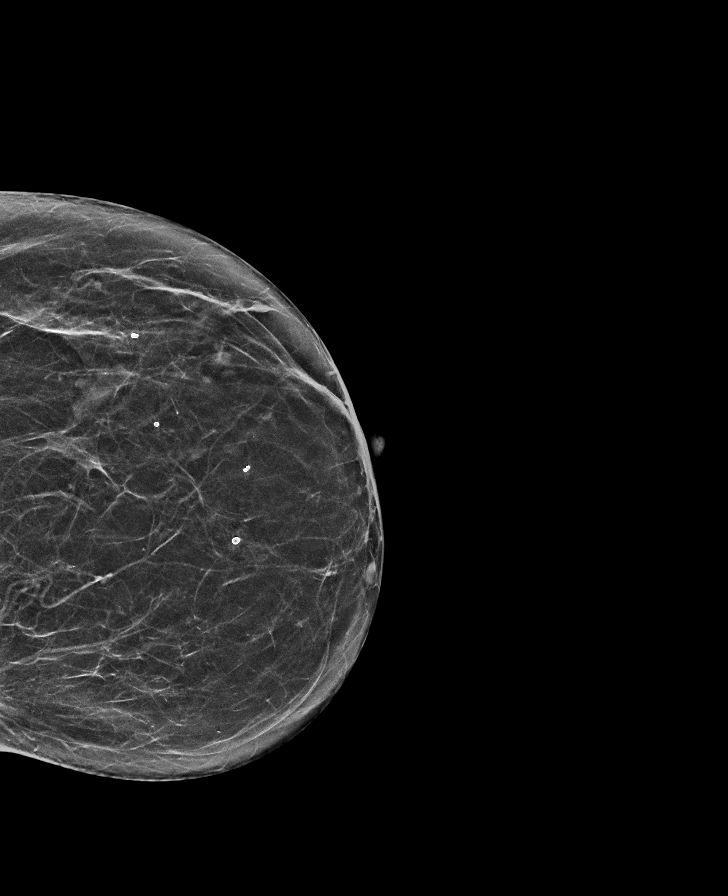

[L MLO synth-2D]
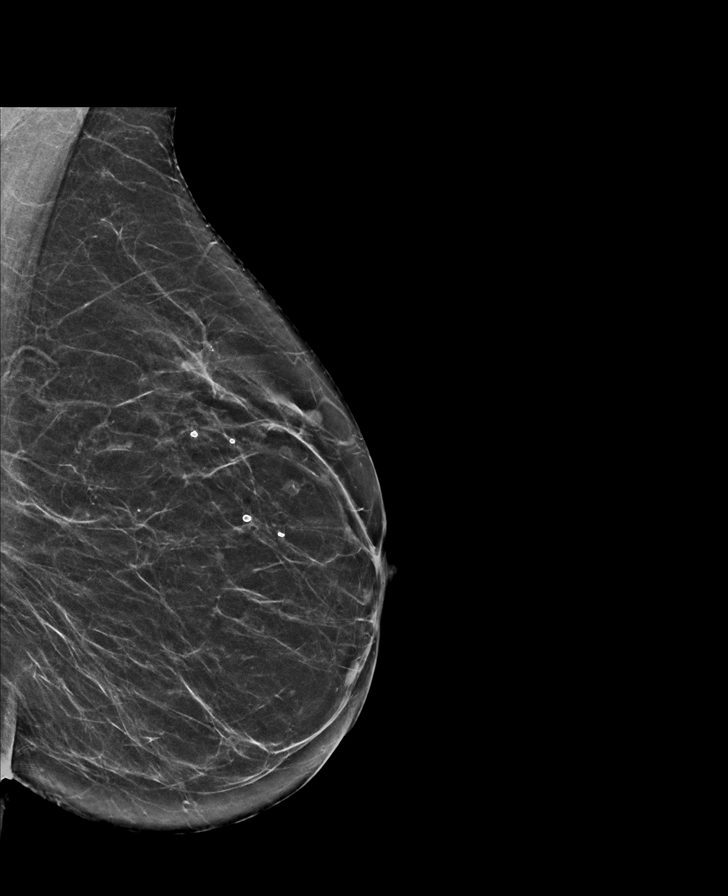

[8 of 28 positions shown; findings below may reference images not displayed]

ACR Breast Density Category b: There are scattered areas of
fibroglandular density.
FINDINGS: There are no findings suspicious for malignancy. Images were
processed with CAD.
IMPRESSION: No mammographic evidence of malignancy. A result letter of this
screening mammogram will be mailed directly to the patient.

RECOMMENDATION:
Screening mammogram in one year. (Code:CN-U-775)

BI-RADS CATEGORY  1: Negative.

## 2022-05-29 ENCOUNTER — Encounter: Payer: Self-pay | Admitting: Emergency Medicine

## 2022-05-29 ENCOUNTER — Ambulatory Visit
Admission: EM | Admit: 2022-05-29 | Discharge: 2022-05-29 | Disposition: A | Discharge status: Home / Self Care | Payer: Medicare HMO | Attending: Emergency Medicine | Admitting: Emergency Medicine

## 2022-05-29 DIAGNOSIS — J3489 Other specified disorders of nose and nasal sinuses: Secondary | ICD-10-CM

## 2022-05-29 DIAGNOSIS — H6123 Impacted cerumen, bilateral: Secondary | ICD-10-CM | POA: Diagnosis not present

## 2022-05-29 NOTE — Discharge Instructions (Addendum)
Purchase an over-the-counter earwax softening agent called Debrox and use it monthly.  You will instill 4 drops in each ear and let it sit for 5 minutes before you shower.  Once in the shower let the water fill up your ear and then tip your head to the side to drain it out to help remove the wax.

## 2022-05-29 NOTE — ED Provider Notes (Signed)
[facility]    CSN: [id number] Arrival date & time: 05/29/22  1302      History   Chief Complaint Chief Complaint  Patient presents with   Otalgia    left    HPI Vanessa Ball is a 71 y.o. female.   HPI  71 year old female here for evaluation of left ear pain.  Patient reports that she has been experiencing pain and pressure in her left ear for the past week.  She denies any ringing in her ear, drainage from the ear, fever, or dizziness.  She states that her ear feels like it is clogged.  She will occasionally have issues with her balance but she is not having any at this time.  She did have COVID 2 weeks ago.  She has been taking over-the-counter Sudafed and over-the-counter eardrops recommended by her pharmacist without any improvement of symptoms.  Past Medical History:  Diagnosis Date   Cancer [facility]) 2011   kidney ca- removed  rt kidney and ureter   History of kidney cancer 2011   Wears dentures    full upper and lower    There are no problems to display for this patient.   Past Surgical History:  Procedure Laterality Date   COLONOSCOPY WITH PROPOFOL N/A 01/25/2018   Procedure: COLONOSCOPY WITH PROPOFOL;  Surgeon: [place], Benay Pike, MD;  Location: [facility] ENDOSCOPY;  Service: Gastroenterology;  Laterality: N/A;   DIRECT LARYNGOSCOPY  2011   [facility]   DIRECT LARYNGOSCOPY Left 03/04/2016   Procedure: DIRECT LARYNGOSCOPY WITH EXCISION OF LEFT VOCAL CORD polyp;  Surgeon: Margaretha Sheffield, MD;  Location: [place];  Service: ENT;  Laterality: Left;   HERNIA REPAIR     NEPHRECTOMY Right 2011   ureter removed also.  [facility]    OB History   No obstetric history on file.      Home Medications    Prior to Admission medications   Medication Sig Start Date End Date Taking? Authorizing Provider  ibandronate (BONIVA) 150 MG tablet Take 150 mg by mouth every 30 (thirty) days. Take in the morning with a full glass of water, on an empty stomach, and do not take  anything else by mouth or lie down for the next 30 min.   Yes [provider]  ascorbic acid (VITAMIN C) 500 MG tablet Take 500 mg by mouth daily.    [provider]  benzonatate (TESSALON) 200 MG capsule Take 200 mg by mouth 3 (three) times daily as needed for cough.    [provider]  calcium citrate-vitamin D (CITRACAL+D) 315-200 MG-UNIT tablet Take 1 tablet by mouth 2 (two) times daily.    [provider]  docusate sodium (COLACE) 100 MG capsule Take 100 mg by mouth 2 (two) times daily as needed for mild constipation.    [provider]  mupirocin cream (BACTROBAN) 2 % Apply 1 application topically 2 (two) times daily. 03/24/17   Frederich Cha, MD    Family History Family History  Problem Relation Age of Onset   Breast cancer Neg Hx     Social History Social History   Tobacco Use   Smoking status: Every Day    Packs/day: 0.50    Years: 30.00    Total pack years: 15.00    Types: Cigarettes   Smokeless tobacco: Never  Vaping Use   Vaping Use: Never used  Substance Use Topics   Alcohol use: No     Allergies   Patient has no  known allergies.   Review of Systems Review of Systems  Constitutional:  Negative for fever.  HENT:  Positive for congestion, ear pain, hearing loss and rhinorrhea. Negative for ear discharge and tinnitus.   Neurological:  Negative for dizziness.  Hematological: Negative.   Psychiatric/Behavioral: Negative.       Physical Exam Triage Vital Signs ED Triage Vitals  Enc Vitals Group     BP 05/29/22 1322 (!) 141/99     Pulse Rate 05/29/22 1322 82     Resp 05/29/22 1322 14     Temp 05/29/22 1322 97.9 F (36.6 C)     Temp Source 05/29/22 1322 Oral     SpO2 05/29/22 1322 99 %     Weight 05/29/22 1320 110 lb (49.9 kg)     Height 05/29/22 1320 '5\' 1"'$  (1.549 m)     Head Circumference --      Peak Flow --      Pain Score 05/29/22 1320 7     Pain Loc --      Pain Edu? --      Excl. in [place]? --    No  data found.  Updated Vital Signs BP (!) 141/99 (BP Location: Left Arm)   Pulse 82   Temp 97.9 F (36.6 C) (Oral)   Resp 14   Ht '5\' 1"'$  (1.549 m)   Wt 110 lb (49.9 kg)   SpO2 99%   BMI 20.78 kg/m   Visual Acuity Right Eye Distance:   Left Eye Distance:   Bilateral Distance:    Right Eye Near:   Left Eye Near:    Bilateral Near:     Physical Exam Vitals and nursing note reviewed.  Constitutional:      Appearance: Normal appearance. She is not ill-appearing.  HENT:     Head: Normocephalic and atraumatic.     Right Ear: External ear normal. There is impacted cerumen.     Left Ear: External ear normal. There is impacted cerumen.     Nose: Congestion and rhinorrhea present.     Mouth/Throat:     Mouth: Mucous membranes are moist.     Pharynx: Oropharynx is clear. No oropharyngeal exudate or posterior oropharyngeal erythema.  Musculoskeletal:     Cervical back: Normal range of motion and neck supple.  Lymphadenopathy:     Cervical: No cervical adenopathy.  Skin:    General: Skin is warm and dry.     Capillary Refill: Capillary refill takes less than 2 seconds.     Findings: No erythema or rash.  Neurological:     General: No focal deficit present.     Mental Status: She is alert and oriented to person, place, and time.  Psychiatric:        Mood and Affect: Mood normal.        Behavior: Behavior normal.        Thought Content: Thought content normal.        Judgment: Judgment normal.      UC Treatments / Results  Labs (all labs ordered are listed, but only abnormal results are displayed) Labs Reviewed - No data to display  EKG   Radiology No results found.  Procedures Procedures (including critical care time)  Medications Ordered in UC Medications - No data to display  Initial Impression / Assessment and Plan / UC Course  I have reviewed the triage vital signs and the nursing notes.  Pertinent labs & imaging results that were available during my care  of the patient were reviewed by me and considered in my medical decision making (see chart for details).  Patient is a very pleasant, nontoxic-appearing 71 year old female here for evaluation of left ear pain, pressure, and a feeling of being clogged has been going on for the past week.  She does have some runny nose and nasal congestion that are still lingering from a COVID infection 2 weeks ago.  She has not had a fever.  Physical exam reveals cerumen impaction in the left external auditory canal with complete occlusion.  The right external auditory canal is largely occluded by cerumen but I am able to visualize the superior portion of the tympanic membrane which is pearly gray in appearance.  Nasal mucosa is pink with mild edema and mild clear nasal discharge.  Oropharyngeal exam is benign.  No cervical lymphadenopathy appreciable exam.  I suspect the patient's symptoms are coming from the cerumen impaction.  I will order lavage of both ears and reassess once lavage is complete.  Ear lavage was productive for large volumes of wax from both ears.  Both external auditory canals are now clear and both tympanic membranes are pearly gray in appearance.  There is no erythema or erosions noted in the external auditory canals bilaterally.  I will discharge patient home with a diagnosis of cerumen impaction.   Final Clinical Impressions(s) / UC Diagnoses   Final diagnoses:  Bilateral impacted cerumen     Discharge Instructions      Purchase an over-the-counter earwax softening agent called Debrox and use it monthly.  You will instill 4 drops in each ear and let it sit for 5 minutes before you shower.  Once in the shower let the water fill up your ear and then tip your head to the side to drain it out to help remove the wax.     ED Prescriptions   None    PDMP not reviewed this encounter.   Margarette Canada, NP 05/29/22 [phone]

## 2022-05-29 NOTE — ED Triage Notes (Signed)
Patient states that she had COVID 2 weeks ago.  Patient states that she has had pain in her left ear for a week.  Patient states that she has been using OTC swimmer ears drops but has not helped.

## 2024-01-02 ENCOUNTER — Ambulatory Visit
Admission: RE | Admit: 2024-01-02 | Discharge: 2024-01-02 | Disposition: A | Discharge status: Home / Self Care | Source: Ambulatory Visit | Attending: Radiation Oncology | Admitting: Radiation Oncology

## 2024-01-02 ENCOUNTER — Other Ambulatory Visit: Payer: Self-pay | Admitting: Radiation Oncology

## 2024-01-02 ENCOUNTER — Encounter (INDEPENDENT_AMBULATORY_CARE_PROVIDER_SITE_OTHER): Payer: Self-pay

## 2024-01-02 ENCOUNTER — Ambulatory Visit
Admission: RE | Admit: 2024-01-02 | Discharge: 2024-01-02 | Disposition: A | Discharge status: Home / Self Care | Attending: *Deleted | Admitting: *Deleted

## 2024-01-02 DIAGNOSIS — C3490 Malignant neoplasm of unspecified part of unspecified bronchus or lung: Secondary | ICD-10-CM

## 2024-01-02 DIAGNOSIS — R0600 Dyspnea, unspecified: Secondary | ICD-10-CM

## 2024-02-23 DEATH — deceased
